# Patient Record
Sex: Female | Born: 2000 | Hispanic: Yes | Marital: Single | Smoking: Never smoker
Health system: Southern US, Community
[De-identification: ages and names within clinical notes are randomized; demographics above are authoritative.]

## PROBLEM LIST (undated history)

## (undated) DIAGNOSIS — F32A Depression, unspecified: Secondary | ICD-10-CM

## (undated) DIAGNOSIS — F329 Major depressive disorder, single episode, unspecified: Secondary | ICD-10-CM

## (undated) DIAGNOSIS — N390 Urinary tract infection, site not specified: Secondary | ICD-10-CM

## (undated) HISTORY — PX: SALPINGECTOMY: SHX328

## (undated) HISTORY — PX: WISDOM TOOTH EXTRACTION: SHX21

## (undated) HISTORY — PX: DILATION AND CURETTAGE OF UTERUS: SHX78

---
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
## 2019-10-02 DIAGNOSIS — O009 Unspecified ectopic pregnancy without intrauterine pregnancy: Secondary | ICD-10-CM | POA: Insufficient documentation

## 2021-01-19 NOTE — L&D Delivery Note (Addendum)
LABOR COURSE SROM, expectant management, epidural.   Delivery Note Called to room and patient was complete and pushing. Head delivered ROA. No nuchal cord present. After 1-min of attempting to deliver shoulder/body, Ashley Brewer, CNM stepped in to manage the dystocia (see her attestation). At 1248 a viable female was delivered via Vaginal, Spontaneous.  Infant with spontaneous cry, placed on mother's abdomen, dried and stimulated for one minute and had a good heart beat and color but little tone and weak breathing efforts. Cord clamped and cut by FOB, baby taken to the warmer. Cord blood drawn. Placenta delivered spontaneously with gentle cord traction. Appears intact. Fundus firm with massage and Pitocin. Labia, perineum, vagina, and cervix inspected with hemostatic periurethral abrasion that needed no repair.   APGAR: 7, 9; weight pending.   Cord: 3VC with the following complications: velamentous insertion    Cord pH: not drawn   Anesthesia:  epidural  Episiotomy: None Lacerations: periurethral abrasion  Est. Blood Loss (mL): 100  Mom to postpartum.  Baby to Couplet care / Skin to Skin.  Ashley Chard, DO 08/22/21 1:06 PM     Attestation of Supervision of Student:  I confirm that I have verified the information documented in the medical student's note and that I have also personally  supervised  the history, physical exam and all medical decision making activities.  I have verified that all services and findings are accurately documented in this student's note; and I agree with management and plan as outlined in the documentation. I have also made any necessary editorial changes.  At one minute after the head delivered, I stepped in to manage the anterior shoulder dystocia. Pt placed in McRobert's position, I grasped the posterior axilla and applied downward pressure to deliver the shoulder and body. Vigorous stimulation given to the baby but as she remained limp with a weak cry/breathing  effort, we clamped/cut at and gave her to the the nurses for attention at the warmer. Baby was back skin to skin on mom by the time we exited the room.  Bernerd Limbo, CNM [facility] [employer] 08/22/2021 1:22 PM

## 2021-01-22 ENCOUNTER — Inpatient Hospital Stay (HOSPITAL_COMMUNITY)
Admission: AD | Admit: 2021-01-22 | Discharge: 2021-01-23 | Disposition: A | Discharge status: Home / Self Care | Payer: Medicaid Other | Attending: Obstetrics and Gynecology | Admitting: Obstetrics and Gynecology

## 2021-01-22 ENCOUNTER — Encounter (HOSPITAL_COMMUNITY): Payer: Self-pay | Admitting: Obstetrics and Gynecology

## 2021-01-22 ENCOUNTER — Other Ambulatory Visit: Payer: Self-pay

## 2021-01-22 DIAGNOSIS — R109 Unspecified abdominal pain: Secondary | ICD-10-CM | POA: Diagnosis not present

## 2021-01-22 DIAGNOSIS — R102 Pelvic and perineal pain: Secondary | ICD-10-CM | POA: Insufficient documentation

## 2021-01-22 DIAGNOSIS — Z3A08 8 weeks gestation of pregnancy: Secondary | ICD-10-CM | POA: Diagnosis not present

## 2021-01-22 DIAGNOSIS — O219 Vomiting of pregnancy, unspecified: Secondary | ICD-10-CM | POA: Insufficient documentation

## 2021-01-22 DIAGNOSIS — O26891 Other specified pregnancy related conditions, first trimester: Secondary | ICD-10-CM | POA: Insufficient documentation

## 2021-01-22 DIAGNOSIS — O3680X0 Pregnancy with inconclusive fetal viability, not applicable or unspecified: Secondary | ICD-10-CM | POA: Insufficient documentation

## 2021-01-22 DIAGNOSIS — O26899 Other specified pregnancy related conditions, unspecified trimester: Secondary | ICD-10-CM

## 2021-01-22 LAB — POCT PREGNANCY, URINE: Preg Test, Ur: POSITIVE — AB

## 2021-01-22 LAB — URINALYSIS, ROUTINE W REFLEX MICROSCOPIC
Bilirubin Urine: NEGATIVE
Glucose, UA: NEGATIVE mg/dL
Hgb urine dipstick: NEGATIVE
Ketones, ur: 80 mg/dL — AB
Leukocytes,Ua: NEGATIVE
Nitrite: NEGATIVE
Protein, ur: NEGATIVE mg/dL
Specific Gravity, Urine: 1.018 (ref 1.005–1.030)
pH: 6 (ref 5.0–8.0)

## 2021-01-22 NOTE — MAU Note (Signed)
..  Ashley Brewer is a 22 y.o. at Unknown here in MAU reporting: positive home pregnancy test yesterday. Reports some abdominal pain, breast soreness, N/V. Reports she throws up after every meal.  Had an ectopic pregnancy last year.  LMP: June Pain score: 6/10 Vitals:   01/22/21 2305  BP: 119/80  Pulse: 93  Resp: 17  Temp: 98.5 F (36.9 C)  SpO2: 100%   Lab orders placed from triage: Pregnancy test

## 2021-01-23 ENCOUNTER — Encounter (HOSPITAL_COMMUNITY): Payer: Self-pay | Admitting: Obstetrics and Gynecology

## 2021-01-23 ENCOUNTER — Inpatient Hospital Stay (HOSPITAL_COMMUNITY): Payer: Medicaid Other

## 2021-01-23 DIAGNOSIS — Z3A08 8 weeks gestation of pregnancy: Secondary | ICD-10-CM

## 2021-01-23 DIAGNOSIS — O26891 Other specified pregnancy related conditions, first trimester: Secondary | ICD-10-CM

## 2021-01-23 DIAGNOSIS — R109 Unspecified abdominal pain: Secondary | ICD-10-CM

## 2021-01-23 LAB — COMPREHENSIVE METABOLIC PANEL
ALT: 19 U/L (ref 0–44)
AST: 18 U/L (ref 15–41)
Albumin: 3.9 g/dL (ref 3.5–5.0)
Alkaline Phosphatase: 40 U/L (ref 38–126)
Anion gap: 8 (ref 5–15)
BUN: <5 mg/dL — ABNORMAL LOW (ref 6–20)
CO2: 21 mmol/L — ABNORMAL LOW (ref 22–32)
Calcium: 9.2 mg/dL (ref 8.9–10.3)
Chloride: 107 mmol/L (ref 98–111)
Creatinine, Ser: 0.57 mg/dL (ref 0.44–1.00)
GFR, Estimated: >60 mL/min (ref >60)
Glucose, Bld: 90 mg/dL (ref 70–99)
Potassium: 3.3 mmol/L — ABNORMAL LOW (ref 3.5–5.1)
Sodium: 136 mmol/L (ref 135–145)
Total Bilirubin: 0.6 mg/dL (ref 0.3–1.2)
Total Protein: 6.4 g/dL — ABNORMAL LOW (ref 6.5–8.1)

## 2021-01-23 LAB — CBC
HCT: 37.6 % (ref 36.0–46.0)
Hemoglobin: 13.1 g/dL (ref 12.0–15.0)
MCH: 30.9 pg (ref 26.0–34.0)
MCHC: 34.8 g/dL (ref 30.0–36.0)
MCV: 88.7 fL (ref 80.0–100.0)
Platelets: 215 10*3/uL (ref 150–400)
RBC: 4.24 MIL/uL (ref 3.87–5.11)
RDW: 11.9 % (ref 11.5–15.5)
WBC: 11 10*3/uL — ABNORMAL HIGH (ref 4.0–10.5)
nRBC: 0 % (ref 0.0–0.2)

## 2021-01-23 LAB — GC/CHLAMYDIA PROBE AMP (~~LOC~~) NOT AT ARMC
Chlamydia: NEGATIVE
Comment: NEGATIVE
Comment: NORMAL
Neisseria Gonorrhea: NEGATIVE

## 2021-01-23 LAB — WET PREP, GENITAL
Sperm: NONE SEEN
Trich, Wet Prep: NONE SEEN
WBC, Wet Prep HPF POC: <10 (ref <10)
Yeast Wet Prep HPF POC: NONE SEEN

## 2021-01-23 LAB — HCG, QUANTITATIVE, PREGNANCY: hCG, Beta Chain, Quant, S: 101093 m[IU]/mL — ABNORMAL HIGH (ref <5)

## 2021-01-23 MED ORDER — PROMETHAZINE HCL 25 MG PO TABS: 25.0000 mg | ORAL_TABLET | Freq: Four times a day (QID) | ORAL | 2 refills | Status: DC | PRN

## 2021-01-23 NOTE — MAU Provider Note (Signed)
Chief Complaint: Abdominal Pain and Nausea   Event Date/Time   First Provider Initiated Contact with Patient 01/23/21 0026        SUBJECTIVE HPI: Ashley Brewer is a 21 y.o. G2P0010 at 2266w5d by LMP who presents to maternity admissions reporting abdominal pain, mostly in upper abdomen, moves around "like gas pain".  Some lower cramping.  Had a left salpingectomy for ruptured ectopic in 2021.. She denies vaginal bleeding, vaginal itching/burning, urinary symptoms, h/a, dizziness, n/v, or fever/chills.    Abdominal Pain This is a new problem. The current episode started today. The problem occurs intermittently. The problem is unchanged. The pain is located in the generalized abdominal region. The quality of the pain is described as cramping. The pain does not radiate. Associated symptoms include nausea. Pertinent negatives include no anorexia, anxiety, constipation, diarrhea, dysuria, fever, myalgias or vomiting. Nothing relieves the symptoms. Treatments tried: simethicone. The treatment provided moderate relief.  RN Note: Ashley Brewer is a 21 y.o. at Unknown here in MAU reporting: positive home pregnancy test yesterday. Reports some abdominal pain, breast soreness, N/V. Reports she throws up after every meal.  Had an ectopic pregnancy last year.  LMP: June Pain score: 6/10  No past medical history on file.  Social History   Socioeconomic History   Marital status: Single    Spouse name: Not on file   Number of children: Not on file   Years of education: Not on file   Highest education level: Not on file  Occupational History   Not on file  Tobacco Use   Smoking status: Not on file   Smokeless tobacco: Not on file  Substance and Sexual Activity   Alcohol use: Not on file   Drug use: Not on file   Sexual activity: Not on file  Other Topics Concern   Not on file  Social History Narrative   Not on file   Social Determinants of Health   Financial Resource Strain: Not on  file  Food Insecurity: Not on file  Transportation Needs: Not on file  Physical Activity: Not on file  Stress: Not on file  Social Connections: Not on file  Intimate Partner Violence: Not on file   No current facility-administered medications on file prior to encounter.   No current outpatient medications on file prior to encounter.   Allergies  Allergen Reactions   Shellfish Allergy Anaphylaxis    I have reviewed patient's Past Medical Hx, Surgical Hx, Family Hx, Social Hx, medications and allergies.   ROS:  Review of Systems  Constitutional:  Negative for fever.  Gastrointestinal:  Positive for abdominal pain and nausea. Negative for anorexia, constipation, diarrhea and vomiting.  Genitourinary:  Negative for dysuria.  Musculoskeletal:  Negative for myalgias.  Psychiatric/Behavioral:  The patient is not nervous/anxious.   Review of Systems  Other systems negative   Physical Exam  Physical Exam Patient Vitals for the past 24 hrs:  BP Temp Temp src Pulse Resp SpO2 Height Weight  01/22/21 2359 122/83 99.3 F (37.4 C) Oral 82 18 100 % -- --  01/22/21 2305 119/80 98.5 F (36.9 C) Oral 93 17 100 % 5' (1.524 m) 80 kg   Constitutional: Well-developed, well-nourished female in no acute distress.  Cardiovascular: normal rate Respiratory: normal effort GI: Abd soft, non-tender.  MS: Extremities nontender, no edema, normal ROM Neurologic: Alert and oriented x 4.  GU: Neg CVAT.  LAB RESULTS Results for orders placed or performed during the hospital encounter of 01/22/21 (  from the past 24 hour(s))  Pregnancy, urine POC     Status: Abnormal   Collection Time: 01/22/21 11:13 PM  Result Value Ref Range   Preg Test, Ur POSITIVE (A) NEGATIVE  Urinalysis, Routine w reflex microscopic Urine, Clean Catch     Status: Abnormal   Collection Time: 01/22/21 11:18 PM  Result Value Ref Range   Color, Urine YELLOW YELLOW   APPearance HAZY (A) CLEAR   Specific Gravity, Urine 1.018 1.005  - 1.030   pH 6.0 5.0 - 8.0   Glucose, UA NEGATIVE NEGATIVE mg/dL   Hgb urine dipstick NEGATIVE NEGATIVE   Bilirubin Urine NEGATIVE NEGATIVE   Ketones, ur 80 (A) NEGATIVE mg/dL   Protein, ur NEGATIVE NEGATIVE mg/dL   Nitrite NEGATIVE NEGATIVE   Leukocytes,Ua NEGATIVE NEGATIVE  Wet prep, genital     Status: Abnormal   Collection Time: 01/23/21 12:33 AM   Specimen: Vaginal  Result Value Ref Range   Yeast Wet Prep HPF POC NONE SEEN NONE SEEN   Trich, Wet Prep NONE SEEN NONE SEEN   Clue Cells Wet Prep HPF POC PRESENT (A) NONE SEEN   WBC, Wet Prep HPF POC <10 <10   Sperm NONE SEEN   CBC     Status: Abnormal   Collection Time: 01/23/21 12:50 AM  Result Value Ref Range   WBC 11.0 (H) 4.0 - 10.5 K/uL   RBC 4.24 3.87 - 5.11 MIL/uL   Hemoglobin 13.1 12.0 - 15.0 g/dL   HCT 58.0 99.8 - 33.8 %   MCV 88.7 80.0 - 100.0 fL   MCH 30.9 26.0 - 34.0 pg   MCHC 34.8 30.0 - 36.0 g/dL   RDW 25.0 53.9 - 76.7 %   Platelets 215 150 - 400 K/uL   nRBC 0.0 0.0 - 0.2 %  Comprehensive metabolic panel     Status: Abnormal   Collection Time: 01/23/21 12:50 AM  Result Value Ref Range   Sodium 136 135 - 145 mmol/L   Potassium 3.3 (L) 3.5 - 5.1 mmol/L   Chloride 107 98 - 111 mmol/L   CO2 21 (L) 22 - 32 mmol/L   Glucose, Bld 90 70 - 99 mg/dL   BUN <5 (L) 6 - 20 mg/dL   Creatinine, Ser 3.41 0.44 - 1.00 mg/dL   Calcium 9.2 8.9 - 93.7 mg/dL   Total Protein 6.4 (L) 6.5 - 8.1 g/dL   Albumin 3.9 3.5 - 5.0 g/dL   AST 18 15 - 41 U/L   ALT 19 0 - 44 U/L   Alkaline Phosphatase 40 38 - 126 U/L   Total Bilirubin 0.6 0.3 - 1.2 mg/dL   GFR, Estimated >90 >24 mL/min   Anion gap 8 5 - 15  hCG, quantitative, pregnancy     Status: Abnormal   Collection Time: 01/23/21 12:50 AM  Result Value Ref Range   hCG, Beta Chain, Quant, S 101,093 (H) <5 mIU/mL     IMAGING US OB Comp Less 14 Wks  Result Date: 01/23/2021 CLINICAL DATA:  Pelvic pain. LMP: 12/07/2020 corresponding to an estimated gestational age of [redacted] weeks, 5  days. EXAM: OBSTETRIC <14 WK ULTRASOUND TECHNIQUE: Transabdominal ultrasound was performed for evaluation of the gestation as well as the maternal uterus and adnexal regions. COMPARISON:  None. FINDINGS: Intrauterine gestational sac: Single intrauterine gestational sac. Yolk sac:  Not seen Embryo:  Present Cardiac Activity: Detected Heart Rate: 152 bpm CRL:   17 mm   8 w 0 d  [place] EDC: 09/04/2021 Subchorionic hemorrhage:  None visualized. Maternal uterus/adnexae: The maternal ovaries are unremarkable. IMPRESSION: Single live intrauterine pregnancy with an estimated gestational age of [redacted] weeks, 0 days. Electronically Signed   By: Elgie Collard M.D.   On: 01/23/2021 01:52     MAU Management/MDM: Ordered usual first trimester r/o ectopic labs.   Pelvic exam and cultures done Will check baseline Ultrasound to rule out ectopic.  This bleeding/pain can represent a normal pregnancy with bleeding, spontaneous abortion or even an ectopic which can be life-threatening.  The process as listed above helps to determine which of these is present.  Reviewed results and intrauterine location of viable single embryo Patient wants to get care here this time, list given  ASSESSMENT Abdominal pain in early pregnancy Pregnancy of unknown location Colicky abdominal pain, improved Confirmed single IUP at [redacted]w[redacted]d   PLAN Discharge home Encouraged to start prenatal care [place] for gas pains. List of providers given  Pt stable at time of discharge. Encouraged to return here if she develops worsening of symptoms, increase in pain, fever, or other concerning symptoms.    Wynelle Bourgeois CNM, MSN Certified Nurse-Midwife 01/23/2021  12:26 AM

## 2021-01-23 NOTE — Discharge Instructions (Signed)
[facility] Providers   [facility]             [address], [place], [place] [address] [phone]  [facility]                                                             [address], [address], [place], [place], [address] [phone]  [facility]                                    [address], [address], , [place], [address] [phone]  [facility] [place] [address], [address], [place], [place], [address] [phone]  [facility]                                 [address], [place], [place], [address] [phone]  [facility]                                    [address], [place], [place], [address] [phone]  [facility] [address], [address], [place], [place], [address]                              [facility] [address], [place], [place], [address] [phone]  [facility]         Phone: [phone]  [facility]      Phone: [phone]   [facility]      Phone: [phone]  [facility]         Phone: [phone]   [facility]    Phone: [phone]  [facility]      Phone: [phone]  [facility]     Phone: [phone]  [facility]      Phone: [phone]  

## 2021-02-11 ENCOUNTER — Telehealth: Payer: Self-pay

## 2021-02-11 NOTE — Telephone Encounter (Signed)
Pt has NOB appt with [place] on 02/20/21. Pt called office with questions. Attempted to call pt back. Pt did not answer and voicemail box was full so I was unable to leave message.MyChart message sent.

## 2021-02-11 NOTE — Telephone Encounter (Signed)
Pt was calling to make sure she can take Vitamin B6 in pregnancy for nausea. Pt was told she can take OTC Vitamin B6 and Unisom per protocol book. Pt states Phenergan made her drowsy and she doesn't want to be drowsy while she works from home. Pt is aware that drowsiness can be a side effect of nausea medication. Pt expressed understanding.

## 2021-02-17 ENCOUNTER — Encounter: Payer: Self-pay | Admitting: Obstetrics and Gynecology

## 2021-02-17 DIAGNOSIS — Z3490 Encounter for supervision of normal pregnancy, unspecified, unspecified trimester: Secondary | ICD-10-CM | POA: Insufficient documentation

## 2021-02-17 NOTE — Progress Notes (Signed)
History:   Ashley Brewer is a 21 y.o. G3P0020 at [redacted]w[redacted]d by LMP, early ultrasound being seen today for her first obstetrical visit.  Her obstetrical history is significant for  ectopic pregnancy but this is a confirmed IUP at 8w . This is a desired pregnancy.  Patient does intend to breast feed.   Patient reports nausea.   Partner/Fiance and FOB at bedside contributing to history.    HISTORY: OB History  Gravida Para Term Preterm AB Living  3 0 0 0 2 0  SAB IAB Ectopic Multiple Live Births  1 0 1 0 0    # Outcome Date GA Lbr Len/2nd Weight Sex Delivery Anes PTL Lv  3 Current           2 Ectopic 10/02/19             Birth Comments: Left tube removed  1 SAB 10/15/17             Past Medical History:  Diagnosis Date   Depression    Urinary tract infection    pt. thinks she';s been dx with UTI   Past Surgical History:  Procedure Laterality Date   DILATION AND CURETTAGE OF UTERUS     SALPINGECTOMY Left    WISDOM TOOTH EXTRACTION     History reviewed. No pertinent family history. Social History   Tobacco Use   Smokeless tobacco: Never  Vaping Use   Vaping Use: Never used  Substance Use Topics   Alcohol use: No    Comment: Patient reports she consumes alcohol (amount unspecified)   Drug use: No    Comment: Patient denies   Allergies  Allergen Reactions   Shellfish Allergy Anaphylaxis   Shrimp [Shellfish Allergy] Nausea And Vomiting and Rash   Current Outpatient Medications on File Prior to Visit  Medication Sig Dispense Refill   Prenatal Vit-Fe Fumarate-FA (PRENATAL VITAMIN PO) Take by mouth.     promethazine (PHENERGAN) 25 MG tablet Take 1 tablet (25 mg total) by mouth every 6 (six) hours as needed for nausea or vomiting. 30 tablet 2   pyridoxine (B-6) 100 MG tablet Take 100 mg by mouth daily.     No current facility-administered medications on file prior to visit.    Review of Systems Pertinent items noted in HPI and remainder of comprehensive ROS  otherwise negative.  Physical Exam:   Vitals:   02/20/21 1307  BP: 121/80  Pulse: (!) 104  Weight: 174 lb (78.9 kg)   Fetal Heart Rate (bpm): 163 General: well-developed, well-nourished female in no acute distress  Breasts:  normal appearance, no masses or tenderness bilaterally  Skin: normal coloration and turgor, no rashes  Neurologic: oriented, normal, negative, normal mood  Extremities: normal strength, tone, and muscle mass, ROM of all joints is normal  HEENT PERRLA, extraocular movement intact and sclera clear, anicteric  Neck supple and no masses  Cardiovascular: regular rate and rhythm  Respiratory:  no respiratory distress, normal breath sounds  Abdomen: soft, non-tender; bowel sounds normal; no masses,  no organomegaly  Pelvic: normal external genitalia, no lesions, normal vaginal mucosa, normal vaginal discharge, normal cervix, pap smear done. Uterine size:  c/w dates    Assessment:    Pregnancy: G3P0020 Patient Active Problem List   Diagnosis Date Noted   Supervision of normal pregnancy 02/17/2021   Ectopic pregnancy 10/02/2019   Macromastia 11/16/2016     Plan:    1. Encounter for supervision of normal pregnancy, antepartum, unspecified gravidity -  Offered and recommended flu shot - pt declines. Discussed risk of flu in pregnancy. Reviewed Tamiflu.  - Discussed waterbirth option if desired by pt, if pregnancy remains uncomplicated, and she meets requirements I.e. class and CNM visit.  - She is doing b6 and phenergan for nausea but phenergan makes her sleepy - we will try pepcid. If this doesn't help, we will try reglan.  - Discussed PT for sciatica and hip pain. She sits a lot for work and thinks this is contributing.   2. MDD (major depressive disorder), recurrent episode, mild (HCC) - She reports this is old history from when she had a dysfunctional relationship with her mom. She has separated her self from her mom and she has no problems now. She denies any  depression and did not feel like she had it before.    Initial labs drawn. Continue prenatal vitamins. Problem list reviewed and updated. Genetic Screening discussed, First trimester screen, Quad screen, and NIPS: declined. Ultrasound discussed; fetal anatomic survey: ordered. Anticipatory guidance about prenatal visits given including labs, ultrasounds, and testing. Discussed usage of Babyscripts and virtual visits as additional source of managing and completing prenatal visits in midst of coronavirus and pandemic.   Encouraged to complete MyChart Registration for her ability to review results, send requests, and have questions addressed.  The nature of [facility] with multiple MDs and Advanced Practice Providers was explained to patient; also emphasized that residents, students are part of our team. Routine obstetric precautions reviewed. Encouraged to seek out care at office or emergency room [facility] MAU preferred) for urgent and/or emergent concerns. Return in about 4 weeks (around 03/20/2021) for OB VISIT, MD or APP.    Milas Hock, MD, FACOG Obstetrician & Gynecologist, [facility], [facility]

## 2021-02-20 ENCOUNTER — Ambulatory Visit (INDEPENDENT_AMBULATORY_CARE_PROVIDER_SITE_OTHER): Payer: Medicaid Other | Admitting: Obstetrics and Gynecology

## 2021-02-20 ENCOUNTER — Encounter: Payer: Self-pay | Admitting: Obstetrics and Gynecology

## 2021-02-20 ENCOUNTER — Other Ambulatory Visit: Payer: Self-pay

## 2021-02-20 ENCOUNTER — Other Ambulatory Visit (HOSPITAL_COMMUNITY)
Admission: RE | Admit: 2021-02-20 | Discharge: 2021-02-20 | Disposition: A | Discharge status: Home / Self Care | Payer: Medicaid Other | Source: Ambulatory Visit | Attending: Obstetrics and Gynecology | Admitting: Obstetrics and Gynecology

## 2021-02-20 VITALS — BP 121/80 | HR 104 | Wt 174.0 lb

## 2021-02-20 DIAGNOSIS — F330 Major depressive disorder, recurrent, mild: Secondary | ICD-10-CM

## 2021-02-20 DIAGNOSIS — Z3490 Encounter for supervision of normal pregnancy, unspecified, unspecified trimester: Secondary | ICD-10-CM | POA: Diagnosis present

## 2021-02-20 DIAGNOSIS — M5432 Sciatica, left side: Secondary | ICD-10-CM

## 2021-02-20 NOTE — Progress Notes (Signed)
Pt declined genetic testing Pt would like a different nausea med that doesn't make her so drowsy

## 2021-02-21 LAB — OBSTETRIC PANEL
Absolute Monocytes: 357 cells/uL (ref 200–950)
Antibody Screen: NOT DETECTED
Basophils Absolute: 33 cells/uL (ref 0–200)
Basophils Relative: 0.4 %
Eosinophils Absolute: 116 cells/uL (ref 15–500)
Eosinophils Relative: 1.4 %
HCT: 35.9 % (ref 35.0–45.0)
Hemoglobin: 12.4 g/dL (ref 11.7–15.5)
Hepatitis B Surface Ag: NONREACTIVE
Lymphs Abs: 2034 cells/uL (ref 850–3900)
MCH: 30.8 pg (ref 27.0–33.0)
MCHC: 34.5 g/dL (ref 32.0–36.0)
MCV: 89.3 fL (ref 80.0–100.0)
MPV: 11.4 fL (ref 7.5–12.5)
Monocytes Relative: 4.3 %
Neutro Abs: 5760 cells/uL (ref 1500–7800)
Neutrophils Relative %: 69.4 %
Platelets: 254 10*3/uL (ref 140–400)
RBC: 4.02 10*6/uL (ref 3.80–5.10)
RDW: 11.9 % (ref 11.0–15.0)
RPR Ser Ql: NONREACTIVE
Rubella: 4.37 Index
Total Lymphocyte: 24.5 %
WBC: 8.3 10*3/uL (ref 3.8–10.8)

## 2021-02-21 LAB — HEPATITIS C ANTIBODY
Hepatitis C Ab: NONREACTIVE
SIGNAL TO CUT-OFF: <0.02 (ref <1.00)

## 2021-02-21 LAB — HIV ANTIBODY (ROUTINE TESTING W REFLEX): HIV 1&2 Ab, 4th Generation: NONREACTIVE

## 2021-02-22 LAB — URINE CULTURE, OB REFLEX

## 2021-02-22 LAB — CULTURE, OB URINE

## 2021-02-24 ENCOUNTER — Encounter: Payer: Self-pay | Admitting: *Deleted

## 2021-02-24 LAB — CYTOLOGY - PAP
Chlamydia: NEGATIVE
Comment: NEGATIVE
Comment: NORMAL
Diagnosis: NEGATIVE
Neisseria Gonorrhea: NEGATIVE

## 2021-03-17 NOTE — Progress Notes (Signed)
? ?  PRENATAL VISIT NOTE ? ?Subjective:  ?Ashley Brewer is a 21 y.o. G3P0020 at [redacted]w[redacted]d being seen today for ongoing prenatal care.  She is currently monitored for the following issues for this low-risk pregnancy and has Ectopic pregnancy; Macromastia; and Supervision of normal pregnancy on their problem list. ? ?Patient reports no complaints except some swelling after work.  Contractions: Not present. Vag. Bleeding: None.  Movement: Absent. Denies leaking of fluid.  ? ?The following portions of the patient's history were reviewed and updated as appropriate: allergies, current medications, past family history, past medical history, past social history, past surgical history and problem list.  ? ?Objective:  ? ?Vitals:  ? 03/20/21 1316  ?BP: 114/74  ?Pulse: 80  ?Weight: 176 lb (79.8 kg)  ? ? ?Fetal Status: Fetal Heart Rate (bpm): 147   Movement: Absent    ? ?General:  Alert, oriented and cooperative. Patient is in no acute distress.  ?Skin: Skin is warm and dry. No rash noted.   ?Cardiovascular: Normal heart rate noted  ?Respiratory: Normal respiratory effort, no problems with respiration noted  ?Abdomen: Soft, gravid, appropriate for gestational age.  Pain/Pressure: Absent     ?Pelvic: Cervical exam deferred        ?Extremities: Normal range of motion.  Edema: None  ?Mental Status: Normal mood and affect. Normal behavior. Normal judgment and thought content.  ? ?Assessment and Plan:  ?Pregnancy: G3P0020 at [redacted]w[redacted]d ?1. Encounter for supervision of normal pregnancy, antepartum, unspecified gravidity ?- Scheduled for anatomy US on 3/23 ?- PT has been helping LBP. Reviewed compression socks and elevating feet for swelling.  ? ?Preterm labor symptoms and general obstetric precautions including but not limited to vaginal bleeding, contractions, leaking of fluid and fetal movement were reviewed in detail with the patient. ?Please refer to After Visit Summary for other counseling recommendations.  ? ?Return in about 4 weeks  (around 04/17/2021) for OB VISIT, MD or APP. ? ?Future Appointments  ?Date Time Provider Department Center  ?04/10/2021 10:45 AM [facility] US5 [facility]  ?04/22/2021  1:30 PM Leftwich-Kirby, Wilmer Floor, CNM [facility]  ? ? ?Milas Hock, MD ?

## 2021-03-20 ENCOUNTER — Other Ambulatory Visit: Payer: Self-pay

## 2021-03-20 ENCOUNTER — Ambulatory Visit (INDEPENDENT_AMBULATORY_CARE_PROVIDER_SITE_OTHER): Payer: Medicaid Other | Admitting: Obstetrics and Gynecology

## 2021-03-20 ENCOUNTER — Encounter: Payer: Self-pay | Admitting: Obstetrics and Gynecology

## 2021-03-20 VITALS — BP 114/74 | HR 80 | Wt 176.0 lb

## 2021-03-20 DIAGNOSIS — Z3490 Encounter for supervision of normal pregnancy, unspecified, unspecified trimester: Secondary | ICD-10-CM

## 2021-04-10 ENCOUNTER — Other Ambulatory Visit: Payer: Self-pay | Admitting: Obstetrics and Gynecology

## 2021-04-10 ENCOUNTER — Encounter: Payer: Self-pay | Admitting: Obstetrics and Gynecology

## 2021-04-10 ENCOUNTER — Other Ambulatory Visit: Payer: Self-pay | Admitting: *Deleted

## 2021-04-10 ENCOUNTER — Other Ambulatory Visit: Payer: Self-pay

## 2021-04-10 ENCOUNTER — Ambulatory Visit: Payer: Medicaid Other | Attending: Obstetrics and Gynecology

## 2021-04-10 DIAGNOSIS — O99212 Obesity complicating pregnancy, second trimester: Secondary | ICD-10-CM | POA: Diagnosis not present

## 2021-04-10 DIAGNOSIS — Z3A19 19 weeks gestation of pregnancy: Secondary | ICD-10-CM | POA: Insufficient documentation

## 2021-04-10 DIAGNOSIS — O43122 Velamentous insertion of umbilical cord, second trimester: Secondary | ICD-10-CM | POA: Insufficient documentation

## 2021-04-10 DIAGNOSIS — Z3490 Encounter for supervision of normal pregnancy, unspecified, unspecified trimester: Secondary | ICD-10-CM

## 2021-04-10 DIAGNOSIS — O283 Abnormal ultrasonic finding on antenatal screening of mother: Secondary | ICD-10-CM | POA: Insufficient documentation

## 2021-04-10 DIAGNOSIS — O43129 Velamentous insertion of umbilical cord, unspecified trimester: Secondary | ICD-10-CM | POA: Insufficient documentation

## 2021-04-10 DIAGNOSIS — O358XX0 Maternal care for other (suspected) fetal abnormality and damage, not applicable or unspecified: Secondary | ICD-10-CM | POA: Diagnosis not present

## 2021-04-10 DIAGNOSIS — Z363 Encounter for antenatal screening for malformations: Secondary | ICD-10-CM | POA: Insufficient documentation

## 2021-04-22 ENCOUNTER — Ambulatory Visit (INDEPENDENT_AMBULATORY_CARE_PROVIDER_SITE_OTHER): Payer: Medicaid Other | Admitting: Advanced Practice Midwife

## 2021-04-22 ENCOUNTER — Telehealth: Payer: Self-pay | Admitting: *Deleted

## 2021-04-22 VITALS — BP 122/73 | HR 84 | Wt 177.0 lb

## 2021-04-22 DIAGNOSIS — O43122 Velamentous insertion of umbilical cord, second trimester: Secondary | ICD-10-CM

## 2021-04-22 DIAGNOSIS — Z3490 Encounter for supervision of normal pregnancy, unspecified, unspecified trimester: Secondary | ICD-10-CM | POA: Diagnosis not present

## 2021-04-22 DIAGNOSIS — O2612 Low weight gain in pregnancy, second trimester: Secondary | ICD-10-CM

## 2021-04-22 DIAGNOSIS — M5432 Sciatica, left side: Secondary | ICD-10-CM

## 2021-04-22 DIAGNOSIS — Z3A20 20 weeks gestation of pregnancy: Secondary | ICD-10-CM

## 2021-04-22 NOTE — Progress Notes (Signed)
? ?  PRENATAL VISIT NOTE ? ?Subjective:  ?Ashley Brewer is a 21 y.o. G3P0020 at [redacted]w[redacted]d being seen today for ongoing prenatal care.  She is currently monitored for the following issues for this low-risk pregnancy and has Ectopic pregnancy; Macromastia; Supervision of normal pregnancy; Echogenic intracardiac focus of fetus on prenatal ultrasound; and Umbilical cord, velamentous insertion on their problem list. ? ?Patient reports  pelvic/back pain, improved with exercises .  Contractions: Not present. Vag. Bleeding: None.  Movement: Absent. Denies leaking of fluid.  ? ?The following portions of the patient's history were reviewed and updated as appropriate: allergies, current medications, past family history, past medical history, past social history, past surgical history and problem list.  ? ?Objective:  ? ?Vitals:  ? 04/22/21 1342  ?BP: 122/73  ?Pulse: 84  ?Weight: 177 lb (80.3 kg)  ? ? ?Fetal Status: Fetal Heart Rate (bpm): 147   Movement: Absent    ? ?General:  Alert, oriented and cooperative. Patient is in no acute distress.  ?Skin: Skin is warm and dry. No rash noted.   ?Cardiovascular: Normal heart rate noted  ?Respiratory: Normal respiratory effort, no problems with respiration noted  ?Abdomen: Soft, gravid, appropriate for gestational age.  Pain/Pressure: Absent     ?Pelvic: Cervical exam deferred        ?Extremities: Normal range of motion.  Edema: None  ?Mental Status: Normal mood and affect. Normal behavior. Normal judgment and thought content.  ? ?Assessment and Plan:  ?Pregnancy: G3P0020 at [redacted]w[redacted]d ?1. Encounter for supervision of normal pregnancy, antepartum, unspecified gravidity ?--Anticipatory guidance about next visits/weeks of pregnancy given.  ?--Interested in waterbirth, questions answered. Pt to enroll in class, see midwives for most visits. Discussed waterbirth as option for low risk pregnancy. Pregnancy is hard to predict and things may arise that prevent immersion in water but labor can be  supported with freedom of movement, birthing ball, shower and birth can be supported in position of pt choosing, etc, even if waterbirth becomes unavailable. Pt states understanding.  ?--Pt with possible velamentous cord insertion and this was discussed at today's visit. If confirmed, pt can often have normal pregnancy, normal vaginal delivery, but continuous monitoring during labor is recommended so she would not be a candidate for WB. Pt and her s/o state understanding.  ?--Encouraged to take childbirth classes and explore labor support techniques other than immersion in water. ? ?2. Sciatica of left side ?--Improved with exercises done at home ? ?3. Velamentous insertion of umbilical cord in second trimester ?--Possible, per MFM [place]. F/U on 4/21. ? ?4. [redacted] weeks gestation of pregnancy ? ? ?5. Poor weight gain of pregnancy, second trimester ?--Pt with poor appetite, reports ~ 10 lb weight loss in pregnancy ?--Discussed dietary options, including liquid calories with smoothies, Ensure.   ?--Pt to try adding calories, follow up on weight at next visit ? ?Preterm labor symptoms and general obstetric precautions including but not limited to vaginal bleeding, contractions, leaking of fluid and fetal movement were reviewed in detail with the patient. ?Please refer to After Visit Summary for other counseling recommendations.  ? ?Return in about 4 weeks (around 05/20/2021) for Midwife preferred, LOB. ? ?Future Appointments  ?Date Time Provider Department Center  ?05/09/2021 11:15 AM [facility] NURSE [facility]  ?05/09/2021 11:30 AM [facility] US2 [facility]  ?05/22/2021  2:30 PM Milas Hock, MD [facility]  ? ? ?Sharen Counter, CNM  ?

## 2021-04-22 NOTE — Telephone Encounter (Signed)
Voicemail full.

## 2021-04-22 NOTE — Patient Instructions (Signed)
?  Considering Waterbirth? ?Guide for patients at [facility] ?Why consider waterbirth? ?Gentle birth for babies  ?Less pain medicine used in labor  ?May allow for passive descent/less pushing  ?May reduce perineal tears  ?More mobility and instinctive maternal position changes  ?Increased maternal relaxation  ? ?Is waterbirth safe? What are the risks of infection, drowning or other complications? ?Infection:  ?Very low risk (3.7 % for tub vs 4.8% for bed)  ?7 in 8000 waterbirths with documented infection  ?Poorly cleaned equipment most common cause  ?Slightly lower group B strep transmission rate  ?Drowning  ?Maternal:  ?Very low risk  ?Related to seizures or fainting  ?Newborn:  ?Very low risk. No evidence of increased risk of respiratory problems in multiple large studies  ?Physiological protection from breathing under water  ?Avoid underwater birth if there are any fetal complications  ?Once baby's head is out of the water, keep it out.  ?Birth complication  ?Some reports of cord trauma, but risk decreased by bringing baby to surface gradually  ?No evidence of increased risk of shoulder dystocia. Mothers can usually change positions faster in water than in a bed, possibly aiding the maneuvers to free the shoulder.  ? ?There are 2 things you MUST do to have a waterbirth with [facility]: ?Attend a waterbirth class at [facility] [place]   ?3rd Wednesday of every month from 7-9 pm (virtual during COVID) ?Free ?Register online at [email or web address] or [email or web address] or by calling [phone] ?Bring us the certificate from the class to your prenatal appointment or send via MyChart ?Meet with a midwife at 36 weeks* to see if you can still plan a waterbirth and to sign the consent.  ? ?*We also recommend that you schedule as many of your prenatal visits with a midwife as possible.   ? ?Helpful information: ?You may want to bring a bathing suit top to the hospital  to wear during labor but this is optional.  All other supplies are provided by the hospital. ?Please arrive at the hospital with signs of active labor, and do not wait at home until late in labor. It takes 45 min- 2 hours for COVID testing, fetal monitoring, and check in to your room to take place, plus transport and filling of the waterbirth tub.   ? ?Things that would prevent you from having a waterbirth: ?Unknown or Positive COVID-19 diagnosis upon admission to hospital* ?Premature, <37wks  ?Previous cesarean birth  ?Presence of thick meconium-stained fluid  ?Multiple gestation (Twins, triplets, etc.)  ?Uncontrolled diabetes or gestational diabetes requiring medication  ?Hypertension diagnosed in pregnancy or preexisting hypertension (gestational hypertension, preeclampsia, or chronic hypertension) ?Fetal growth restriction (your baby measures less than 10th percentile on ultrasound) ?Heavy vaginal bleeding  ?Non-reassuring fetal heart rate  ?Active infection (MRSA, etc.). Group B Strep is NOT a contraindication for waterbirth.  ?If your labor has to be induced and induction method requires continuous monitoring of the baby's heart rate  ?Other risks/issues identified by your obstetrical provider  ? ?Please remember that birth is unpredictable. Under certain unforeseeable circumstances your provider may advise against giving birth in the tub. These decisions will be made on a case-by-case basis and with the safety of you and your baby as our highest priority. ? ? ?*Please remember that in order to have a waterbirth, you must test Negative to COVID-19 upon admission to the hospital. ? ?Updated 04/29/20 ? ?

## 2021-04-23 ENCOUNTER — Encounter: Payer: Self-pay | Admitting: Advanced Practice Midwife

## 2021-04-23 ENCOUNTER — Telehealth: Payer: Self-pay

## 2021-04-23 NOTE — Telephone Encounter (Signed)
Pt called upset that the diagnosis of "depression" is in her chart and on PMH form. Pt states she discussed this with Dr.Duncan and thinks that her pediatrician misdiagnosed her with this. Pt was told that Dr.Duncan did remove other diagnosis out her chart as requested by pt such as "oppositional defiant disorder" as well as others but "depression" is still under her medical history. Pt's partner was yelling in the background calling me a "liar". I spoke with Dr.Duncan and reassured pt that she did remove certain diagnosis that was requested. Pt is requesting I remove "depression" out of her chart. Dr.Duncan approved me taking depression out of pt's chart. I assured pt I would take it out and I will fill out a new PMH form that does not include any mental health/illness diagnosis. Caseworker has not picked up PMH form yet so I will shred the old one and have them pick up the new one that does not include depression.  ?

## 2021-05-09 ENCOUNTER — Encounter: Payer: Self-pay | Admitting: *Deleted

## 2021-05-09 ENCOUNTER — Ambulatory Visit: Payer: Medicaid Other | Attending: Obstetrics

## 2021-05-09 ENCOUNTER — Other Ambulatory Visit: Payer: Self-pay | Admitting: *Deleted

## 2021-05-09 ENCOUNTER — Ambulatory Visit: Payer: Medicaid Other | Admitting: *Deleted

## 2021-05-09 VITALS — BP 120/68 | HR 82

## 2021-05-09 DIAGNOSIS — Z363 Encounter for antenatal screening for malformations: Secondary | ICD-10-CM | POA: Insufficient documentation

## 2021-05-09 DIAGNOSIS — Z3A23 23 weeks gestation of pregnancy: Secondary | ICD-10-CM

## 2021-05-09 DIAGNOSIS — O358XX0 Maternal care for other (suspected) fetal abnormality and damage, not applicable or unspecified: Secondary | ICD-10-CM | POA: Insufficient documentation

## 2021-05-09 DIAGNOSIS — O99212 Obesity complicating pregnancy, second trimester: Secondary | ICD-10-CM | POA: Diagnosis not present

## 2021-05-09 DIAGNOSIS — Z3490 Encounter for supervision of normal pregnancy, unspecified, unspecified trimester: Secondary | ICD-10-CM | POA: Insufficient documentation

## 2021-05-09 DIAGNOSIS — E669 Obesity, unspecified: Secondary | ICD-10-CM | POA: Diagnosis not present

## 2021-05-09 DIAGNOSIS — O43122 Velamentous insertion of umbilical cord, second trimester: Secondary | ICD-10-CM

## 2021-05-09 DIAGNOSIS — O283 Abnormal ultrasonic finding on antenatal screening of mother: Secondary | ICD-10-CM

## 2021-05-22 ENCOUNTER — Encounter: Payer: Medicaid Other | Admitting: Obstetrics and Gynecology

## 2021-06-03 NOTE — Progress Notes (Signed)
   PRENATAL VISIT NOTE  Subjective:  Ashley Brewer is a 21 y.o. G3P0020 at [redacted]w[redacted]d being seen today for ongoing prenatal care.  She is currently monitored for the following issues for this high-risk pregnancy and has Ectopic pregnancy; Macromastia; Supervision of normal pregnancy; Echogenic intracardiac focus of fetus on prenatal ultrasound; and Umbilical cord, velamentous insertion on their problem list.  Patient reports  reflux and abdominal discomfort Discomfort is sporadic, not timeable like PTL.  Marland Kitchen  Contractions: Not present. Vag. Bleeding: None.  Movement: Present. Denies leaking of fluid.   The following portions of the patient's history were reviewed and updated as appropriate: allergies, current medications, past family history, past medical history, past social history, past surgical history and problem list.   Objective:   Vitals:   06/05/21 1348  BP: 105/74  Pulse: 77  Weight: 180 lb (81.6 kg)    Fetal Status: Fetal Heart Rate (bpm): 147   Movement: Present     General:  Alert, oriented and cooperative. Patient is in no acute distress.  Skin: Skin is warm and dry. No rash noted.   Cardiovascular: Normal heart rate noted  Respiratory: Normal respiratory effort, no problems with respiration noted  Abdomen: Soft, gravid, appropriate for gestational age.  Pain/Pressure: Present     Pelvic: Cervical exam deferred        Extremities: Normal range of motion.  Edema: None  Mental Status: Normal mood and affect. Normal behavior. Normal judgment and thought content.   Assessment and Plan:  Pregnancy: G3P0020 at [redacted]w[redacted]d 1. Velamentous insertion of umbilical cord in second trimester - Growth normal on 4/21: 33%ile. Will continue to monitor growth  2. Encounter for supervision of normal pregnancy, antepartum, unspecified gravidity - 28w labs discussed and will be scheduled - Tdap recommended - pt will consider. Information given - Reiterated belly band for discomfort.  -  Reviewed tums for reflux.   3. Echogenic intracardiac focus of fetus on prenatal ultrasound Persistent on last [place]. Declined genetics  Preterm labor symptoms and general obstetric precautions including but not limited to vaginal bleeding, contractions, leaking of fluid and fetal movement were reviewed in detail with the patient. Please refer to After Visit Summary for other counseling recommendations.   Return in about 3 weeks (around 06/26/2021) for 2 hr GTT.  Future Appointments  Date Time Provider Department Center  06/12/2021 12:30 PM [facility] NURSE [facility]  06/12/2021 12:45 PM [facility]    Milas Hock, MD

## 2021-06-05 ENCOUNTER — Ambulatory Visit (INDEPENDENT_AMBULATORY_CARE_PROVIDER_SITE_OTHER): Payer: Medicaid Other | Admitting: Obstetrics and Gynecology

## 2021-06-05 ENCOUNTER — Encounter: Payer: Self-pay | Admitting: Obstetrics and Gynecology

## 2021-06-05 VITALS — BP 105/74 | HR 77 | Wt 180.0 lb

## 2021-06-05 DIAGNOSIS — O43122 Velamentous insertion of umbilical cord, second trimester: Secondary | ICD-10-CM

## 2021-06-05 DIAGNOSIS — Z3490 Encounter for supervision of normal pregnancy, unspecified, unspecified trimester: Secondary | ICD-10-CM

## 2021-06-05 DIAGNOSIS — O283 Abnormal ultrasonic finding on antenatal screening of mother: Secondary | ICD-10-CM

## 2021-06-05 NOTE — Progress Notes (Signed)
Pt c/o heartburn Pt c/o lower abdominal pain- pregnancy support band recommended

## 2021-06-12 ENCOUNTER — Other Ambulatory Visit: Payer: Self-pay | Admitting: *Deleted

## 2021-06-12 ENCOUNTER — Ambulatory Visit: Payer: Medicaid Other | Admitting: *Deleted

## 2021-06-12 ENCOUNTER — Encounter: Payer: Self-pay | Admitting: *Deleted

## 2021-06-12 ENCOUNTER — Ambulatory Visit: Payer: Medicaid Other | Attending: Obstetrics

## 2021-06-12 VITALS — BP 117/68 | HR 81

## 2021-06-12 DIAGNOSIS — O43122 Velamentous insertion of umbilical cord, second trimester: Secondary | ICD-10-CM | POA: Diagnosis present

## 2021-06-12 DIAGNOSIS — O99213 Obesity complicating pregnancy, third trimester: Secondary | ICD-10-CM | POA: Diagnosis not present

## 2021-06-12 DIAGNOSIS — Z3A28 28 weeks gestation of pregnancy: Secondary | ICD-10-CM | POA: Diagnosis not present

## 2021-06-12 DIAGNOSIS — O99212 Obesity complicating pregnancy, second trimester: Secondary | ICD-10-CM | POA: Diagnosis present

## 2021-06-12 DIAGNOSIS — O283 Abnormal ultrasonic finding on antenatal screening of mother: Secondary | ICD-10-CM | POA: Diagnosis present

## 2021-06-12 DIAGNOSIS — O43123 Velamentous insertion of umbilical cord, third trimester: Secondary | ICD-10-CM

## 2021-06-12 DIAGNOSIS — Z6834 Body mass index (BMI) 34.0-34.9, adult: Secondary | ICD-10-CM | POA: Diagnosis present

## 2021-06-12 DIAGNOSIS — O358XX0 Maternal care for other (suspected) fetal abnormality and damage, not applicable or unspecified: Secondary | ICD-10-CM | POA: Diagnosis not present

## 2021-06-12 DIAGNOSIS — E669 Obesity, unspecified: Secondary | ICD-10-CM

## 2021-07-08 ENCOUNTER — Ambulatory Visit: Payer: Medicaid Other | Admitting: *Deleted

## 2021-07-08 DIAGNOSIS — Z3490 Encounter for supervision of normal pregnancy, unspecified, unspecified trimester: Secondary | ICD-10-CM

## 2021-07-08 NOTE — Progress Notes (Addendum)
Pt here for 28 week labs only.  She has rescheduled her ROB appt.  She is declining her HIV testing

## 2021-07-08 NOTE — Addendum Note (Signed)
Addended by: Granville Lewis on: 07/08/2021 09:29 AM   Modules accepted: Orders

## 2021-07-09 ENCOUNTER — Encounter: Payer: Self-pay | Admitting: Obstetrics and Gynecology

## 2021-07-09 LAB — CBC
HCT: 34.9 % — ABNORMAL LOW (ref 35.0–45.0)
Hemoglobin: 12 g/dL (ref 11.7–15.5)
MCH: 30.6 pg (ref 27.0–33.0)
MCHC: 34.4 g/dL (ref 32.0–36.0)
MCV: 89 fL (ref 80.0–100.0)
MPV: 12.6 fL — ABNORMAL HIGH (ref 7.5–12.5)
Platelets: 208 10*3/uL (ref 140–400)
RBC: 3.92 10*6/uL (ref 3.80–5.10)
RDW: 12.6 % (ref 11.0–15.0)
WBC: 8.5 10*3/uL (ref 3.8–10.8)

## 2021-07-09 LAB — RPR: RPR Ser Ql: NONREACTIVE

## 2021-07-17 ENCOUNTER — Ambulatory Visit: Payer: Medicaid Other

## 2021-07-18 ENCOUNTER — Telehealth: Payer: Self-pay

## 2021-07-21 ENCOUNTER — Telehealth: Payer: Self-pay | Admitting: *Deleted

## 2021-07-21 ENCOUNTER — Ambulatory Visit: Payer: Medicaid Other

## 2021-07-21 NOTE — Telephone Encounter (Signed)
Pt called and left a message to have a call back.  I have called and it went to voicemail.  LM for pt to return my call.

## 2021-07-31 ENCOUNTER — Encounter: Payer: Medicaid Other | Admitting: Obstetrics and Gynecology

## 2021-08-04 ENCOUNTER — Ambulatory Visit: Payer: Medicaid Other | Admitting: *Deleted

## 2021-08-04 ENCOUNTER — Ambulatory Visit: Payer: Medicaid Other | Attending: Obstetrics

## 2021-08-04 VITALS — BP 108/72 | HR 84

## 2021-08-04 DIAGNOSIS — Z6834 Body mass index (BMI) 34.0-34.9, adult: Secondary | ICD-10-CM | POA: Diagnosis present

## 2021-08-04 DIAGNOSIS — Z3A35 35 weeks gestation of pregnancy: Secondary | ICD-10-CM

## 2021-08-04 DIAGNOSIS — O43123 Velamentous insertion of umbilical cord, third trimester: Secondary | ICD-10-CM | POA: Diagnosis not present

## 2021-08-04 DIAGNOSIS — E669 Obesity, unspecified: Secondary | ICD-10-CM | POA: Diagnosis not present

## 2021-08-04 DIAGNOSIS — O35BXX0 Maternal care for other (suspected) fetal abnormality and damage, fetal cardiac anomalies, not applicable or unspecified: Secondary | ICD-10-CM | POA: Diagnosis not present

## 2021-08-04 DIAGNOSIS — O99213 Obesity complicating pregnancy, third trimester: Secondary | ICD-10-CM

## 2021-08-08 ENCOUNTER — Ambulatory Visit: Payer: Medicaid Other

## 2021-08-14 ENCOUNTER — Encounter: Payer: Medicaid Other | Admitting: Obstetrics and Gynecology

## 2021-08-22 ENCOUNTER — Encounter (HOSPITAL_COMMUNITY): Payer: Self-pay | Admitting: Obstetrics and Gynecology

## 2021-08-22 ENCOUNTER — Inpatient Hospital Stay (HOSPITAL_COMMUNITY): Payer: Medicaid Other | Admitting: Anesthesiology

## 2021-08-22 ENCOUNTER — Inpatient Hospital Stay (HOSPITAL_COMMUNITY)
Admission: AD | Admit: 2021-08-22 | Discharge: 2021-08-24 | DRG: 807 | Disposition: A | Discharge status: Home / Self Care | Payer: Medicaid Other | Attending: Obstetrics and Gynecology | Admitting: Obstetrics and Gynecology

## 2021-08-22 ENCOUNTER — Other Ambulatory Visit: Payer: Self-pay

## 2021-08-22 DIAGNOSIS — Z3A38 38 weeks gestation of pregnancy: Secondary | ICD-10-CM

## 2021-08-22 DIAGNOSIS — O26893 Other specified pregnancy related conditions, third trimester: Secondary | ICD-10-CM | POA: Diagnosis present

## 2021-08-22 DIAGNOSIS — O43123 Velamentous insertion of umbilical cord, third trimester: Secondary | ICD-10-CM | POA: Diagnosis present

## 2021-08-22 DIAGNOSIS — O99214 Obesity complicating childbirth: Secondary | ICD-10-CM | POA: Diagnosis present

## 2021-08-22 DIAGNOSIS — O4202 Full-term premature rupture of membranes, onset of labor within 24 hours of rupture: Secondary | ICD-10-CM

## 2021-08-22 LAB — CBC
HCT: 38.3 % (ref 36.0–46.0)
Hemoglobin: 13 g/dL (ref 12.0–15.0)
MCH: 30.1 pg (ref 26.0–34.0)
MCHC: 33.9 g/dL (ref 30.0–36.0)
MCV: 88.7 fL (ref 80.0–100.0)
Platelets: 181 10*3/uL (ref 150–400)
RBC: 4.32 MIL/uL (ref 3.87–5.11)
RDW: 13.2 % (ref 11.5–15.5)
WBC: 11.8 10*3/uL — ABNORMAL HIGH (ref 4.0–10.5)
nRBC: 0 % (ref 0.0–0.2)

## 2021-08-22 LAB — TYPE AND SCREEN
ABO/RH(D): O POS
Antibody Screen: NEGATIVE

## 2021-08-22 LAB — POCT FERN TEST
POCT Fern Test: POSITIVE
POCT Fern Test: POSITIVE

## 2021-08-22 LAB — GROUP B STREP BY PCR: Group B strep by PCR: NEGATIVE

## 2021-08-22 LAB — RPR: RPR Ser Ql: NONREACTIVE

## 2021-08-22 LAB — HEMOGLOBIN A1C
Hgb A1c MFr Bld: 4.3 % — ABNORMAL LOW (ref 4.8–5.6)
Mean Plasma Glucose: 76.71 mg/dL

## 2021-08-22 LAB — HIV ANTIBODY (ROUTINE TESTING W REFLEX): HIV Screen 4th Generation wRfx: NONREACTIVE

## 2021-08-22 MED ORDER — ONDANSETRON HCL 4 MG/2ML IJ SOLN
4.0000 mg | Freq: Once | INTRAMUSCULAR | Status: AC
  Administered 2021-08-22: 4 mg via INTRAVENOUS
  Filled 2021-08-22: qty 2

## 2021-08-22 MED ORDER — ONDANSETRON HCL 4 MG/2ML IJ SOLN: 4.0000 mg | INTRAMUSCULAR | Status: DC | PRN

## 2021-08-22 MED ORDER — LACTATED RINGERS IV SOLN: INTRAVENOUS | Status: DC

## 2021-08-22 MED ORDER — PHENYLEPHRINE 80 MCG/ML (10ML) SYRINGE FOR IV PUSH (FOR BLOOD PRESSURE SUPPORT): 80.0000 ug | PREFILLED_SYRINGE | INTRAVENOUS | Status: DC | PRN

## 2021-08-22 MED ORDER — LACTATED RINGERS IV SOLN: 500.0000 mL | INTRAVENOUS | Status: DC | PRN

## 2021-08-22 MED ORDER — LIDOCAINE HCL (PF) 1 % IJ SOLN: 30.0000 mL | INTRAMUSCULAR | Status: DC | PRN

## 2021-08-22 MED ORDER — ZOLPIDEM TARTRATE 5 MG PO TABS: 5.0000 mg | ORAL_TABLET | Freq: Every evening | ORAL | Status: DC | PRN

## 2021-08-22 MED ORDER — DIPHENHYDRAMINE HCL 50 MG/ML IJ SOLN: 12.5000 mg | INTRAMUSCULAR | Status: DC | PRN

## 2021-08-22 MED ORDER — WITCH HAZEL-GLYCERIN EX PADS: 1.0000 | MEDICATED_PAD | CUTANEOUS | Status: DC | PRN

## 2021-08-22 MED ORDER — EPHEDRINE 5 MG/ML INJ: 10.0000 mg | INTRAVENOUS | Status: DC | PRN

## 2021-08-22 MED ORDER — SOD CITRATE-CITRIC ACID 500-334 MG/5ML PO SOLN: 30.0000 mL | ORAL | Status: DC | PRN

## 2021-08-22 MED ORDER — SIMETHICONE 80 MG PO CHEW: 80.0000 mg | CHEWABLE_TABLET | ORAL | Status: DC | PRN

## 2021-08-22 MED ORDER — ACETAMINOPHEN 325 MG PO TABS: 650.0000 mg | ORAL_TABLET | ORAL | Status: DC | PRN

## 2021-08-22 MED ORDER — FENTANYL CITRATE (PF) 100 MCG/2ML IJ SOLN: 50.0000 ug | INTRAMUSCULAR | Status: DC | PRN

## 2021-08-22 MED ORDER — ONDANSETRON HCL 4 MG/2ML IJ SOLN: 4.0000 mg | Freq: Four times a day (QID) | INTRAMUSCULAR | Status: DC | PRN

## 2021-08-22 MED ORDER — BENZOCAINE-MENTHOL 20-0.5 % EX AERO
1.0000 | INHALATION_SPRAY | CUTANEOUS | Status: DC | PRN
  Administered 2021-08-22: 1 via TOPICAL
  Filled 2021-08-22: qty 56

## 2021-08-22 MED ORDER — TETANUS-DIPHTH-ACELL PERTUSSIS 5-2.5-18.5 LF-MCG/0.5 IM SUSY: 0.5000 mL | PREFILLED_SYRINGE | Freq: Once | INTRAMUSCULAR | Status: DC

## 2021-08-22 MED ORDER — PENICILLIN G POT IN DEXTROSE 60000 UNIT/ML IV SOLN: 3.0000 10*6.[IU] | INTRAVENOUS | Status: DC

## 2021-08-22 MED ORDER — DIPHENHYDRAMINE HCL 25 MG PO CAPS: 25.0000 mg | ORAL_CAPSULE | Freq: Four times a day (QID) | ORAL | Status: DC | PRN

## 2021-08-22 MED ORDER — IBUPROFEN 600 MG PO TABS
600.0000 mg | ORAL_TABLET | Freq: Four times a day (QID) | ORAL | Status: DC
  Administered 2021-08-22 – 2021-08-24 (×6): 600 mg via ORAL
  Filled 2021-08-22 (×7): qty 1

## 2021-08-22 MED ORDER — FENTANYL CITRATE (PF) 100 MCG/2ML IJ SOLN
50.0000 ug | Freq: Once | INTRAMUSCULAR | Status: AC
  Administered 2021-08-22: 50 ug via INTRAVENOUS
  Filled 2021-08-22: qty 2

## 2021-08-22 MED ORDER — OXYTOCIN-SODIUM CHLORIDE 30-0.9 UT/500ML-% IV SOLN
2.5000 [IU]/h | INTRAVENOUS | Status: DC
  Administered 2021-08-22: 2.5 [IU]/h via INTRAVENOUS
  Filled 2021-08-22: qty 500

## 2021-08-22 MED ORDER — PRENATAL MULTIVITAMIN CH
1.0000 | ORAL_TABLET | Freq: Every day | ORAL | Status: DC
  Filled 2021-08-22: qty 1

## 2021-08-22 MED ORDER — OXYTOCIN BOLUS FROM INFUSION
333.0000 mL | Freq: Once | INTRAVENOUS | Status: AC
  Administered 2021-08-22: 333 mL via INTRAVENOUS

## 2021-08-22 MED ORDER — ACETAMINOPHEN 325 MG PO TABS
650.0000 mg | ORAL_TABLET | ORAL | Status: DC | PRN
  Administered 2021-08-22: 650 mg via ORAL
  Filled 2021-08-22: qty 2

## 2021-08-22 MED ORDER — LIDOCAINE HCL (PF) 1 % IJ SOLN
INTRAMUSCULAR | Status: DC | PRN
  Administered 2021-08-22: 11 mL via EPIDURAL

## 2021-08-22 MED ORDER — ONDANSETRON HCL 4 MG PO TABS: 4.0000 mg | ORAL_TABLET | ORAL | Status: DC | PRN

## 2021-08-22 MED ORDER — LACTATED RINGERS IV SOLN: 500.0000 mL | Freq: Once | INTRAVENOUS | Status: DC

## 2021-08-22 MED ORDER — DIBUCAINE (PERIANAL) 1 % EX OINT
1.0000 | TOPICAL_OINTMENT | CUTANEOUS | Status: DC | PRN
Start: 2021-08-22 — End: 2021-08-24

## 2021-08-22 MED ORDER — COCONUT OIL OIL: 1.0000 | TOPICAL_OIL | Status: DC | PRN

## 2021-08-22 MED ORDER — SODIUM CHLORIDE 0.9 % IV SOLN
5.0000 10*6.[IU] | Freq: Once | INTRAVENOUS | Status: AC
  Administered 2021-08-22: 5 10*6.[IU] via INTRAVENOUS
  Filled 2021-08-22: qty 5

## 2021-08-22 MED ORDER — SENNOSIDES-DOCUSATE SODIUM 8.6-50 MG PO TABS
2.0000 | ORAL_TABLET | Freq: Every day | ORAL | Status: DC
Start: 2021-08-23 — End: 2021-08-24
  Filled 2021-08-22: qty 2

## 2021-08-22 MED ORDER — FENTANYL-BUPIVACAINE-NACL 0.5-0.125-0.9 MG/250ML-% EP SOLN
12.0000 mL/h | EPIDURAL | Status: DC | PRN
  Administered 2021-08-22: 12 mL/h via EPIDURAL
  Filled 2021-08-22: qty 250

## 2021-08-22 NOTE — H&P (Signed)
LABOR AND DELIVERY ADMISSION HISTORY AND PHYSICAL NOTE  Ashley Brewer is a 21 y.o. female G3P0020 with IUP at [redacted]w[redacted]d by 8 week [place] presenting for SROM and SOL. She endorses recurrent painful contractions. She endorses positive fetal movement. She denies vaginal bleeding.  She plans on breast feeding. Her contraception plan is: undecided.  Prenatal History/Complications: PNC at [facility]  Sono:  @[redacted]w[redacted]d , CWD, normal anatomy, cephalic presentation, anterior placenta, 16%ile, EFW 16%  Pregnancy complications:  - Obesity - Velamentous cord insertion - Insufficient prenatal care third trimester (not seen in office since 06/05/2021)  Past Medical History: Past Medical History:  Diagnosis Date   Urinary tract infection    pt. thinks she';s been dx with UTI    Past Surgical History: Past Surgical History:  Procedure Laterality Date   DILATION AND CURETTAGE OF UTERUS     SALPINGECTOMY Left    WISDOM TOOTH EXTRACTION      Obstetrical History: OB History     Gravida  3   Para  0   Term  0   Preterm  0   AB  2   Living         SAB  1   IAB  0   Ectopic  1   Multiple      Live Births              Social History: Social History   Socioeconomic History   Marital status: Single    Spouse name: Not on file   Number of children: Not on file   Years of education: Not on file   Highest education level: Not on file  Occupational History   Not on file  Tobacco Use   Smoking status: Never   Smokeless tobacco: Never  Vaping Use   Vaping Use: Never used  Substance and Sexual Activity   Alcohol use: No    Comment: Patient reports she consumes alcohol (amount unspecified)   Drug use: No    Comment: Patient denies   Sexual activity: Yes    Birth control/protection: None    Comment: "ran out months ago"  Other Topics Concern   Not on file  Social History Narrative   ** Merged History Encounter **       Social Determinants of Health   Financial Resource  Strain: Not on file  Food Insecurity: Not on file  Transportation Needs: Not on file  Physical Activity: Not on file  Stress: Not on file  Social Connections: Not on file    Family History: No family history on file.  Allergies: Allergies  Allergen Reactions   Shellfish Allergy Anaphylaxis   Shrimp [Shellfish Allergy] Nausea And Vomiting and Rash    Medications Prior to Admission  Medication Sig Dispense Refill Last Dose   Prenatal Vit-Fe Fumarate-FA (PRENATAL VITAMIN PO) Take by mouth.      promethazine (PHENERGAN) 25 MG tablet Take 1 tablet (25 mg total) by mouth every 6 (six) hours as needed for nausea or vomiting. 30 tablet 2    pyridoxine (B-6) 100 MG tablet Take 100 mg by mouth daily. (Patient not taking: Reported on 06/05/2021)        Review of Systems  All systems reviewed and negative except as stated in HPI  Physical Exam Blood pressure 129/69, pulse 75, temperature 98.1 F (36.7 C), resp. rate 17, height 5' (1.524 m), weight 85.3 kg, last menstrual period 12/07/2020, SpO2 100 %. General appearance: alert, oriented, NAD Lungs: normal respiratory effort Heart:  regular rate Abdomen: soft, non-tender; gravid Extremities: No calf swelling or tenderness Presentation: cephalic by suture per RN exam  Fetal monitoringBaseline: 130 bpm, Variability: Good {> 6 bpm), Accelerations: Non-reactive but appropriate for gestational age, and Decelerations: Absent Uterine activity: q 2-4 min  Dilation: 4 Effacement (%): 80 Station: 0 Exam by:: [name], RN.  Prenatal labs: ABO, Rh: O/RH(D) POSITIVE/-- (02/02 1402) Antibody: NO ANTIBODIES DETECTED (02/02 1402) Rubella: 4.37 (02/02 1402) RPR: NON-REACTIVE (06/20 0946)  HBsAg: NON-REACTIVE (02/02 1402)  HIV: NON-REACTIVE (02/02 1402)  GC/Chlamydia: Ordered on admission  GBS:   PCR ordered on admission 2-hr GTT: Not visible on chart, HgbA1C ordered on admission Genetic screening:  Declined by patient Anatomy US:  WNL  Prenatal Transfer Tool  Maternal Diabetes: No Genetic Screening: Declined Maternal Ultrasounds/Referrals: Velamentous cord insertion Fetal Ultrasounds or other Referrals:  None Maternal Substance Abuse:  No Significant Maternal Medications:  None Significant Maternal Lab Results: None  Results for orders placed or performed during the hospital encounter of 08/22/21 (from the past 24 hour(s))  POCT fern test   Collection Time: 08/22/21  6:43 AM  Result Value Ref Range   POCT Fern Test Positive = ruptured amniotic membanes     Patient Active Problem List   Diagnosis Date Noted   Echogenic intracardiac focus of fetus on prenatal ultrasound 04/10/2021   Umbilical cord, velamentous insertion 04/10/2021   Supervision of normal pregnancy 02/17/2021   Ectopic pregnancy 10/02/2019   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 G3P0020 at [redacted]w[redacted]d here for SROM at 0300  #Labor: Expectant management #Pain: IV pain meds PRN, epidural upon request #FWB: Reassuring, difficulty tolerating monitors in MAU #GBS/ID: PCR ordered on admission #MOF: Breast #MOC: Undecided #Circ: N/A baby girl #PP: Patient agreeable to HIV lab collection with admission labs  Calvert Cantor, MSA, MSN, CNM 08/22/2021, 6:47 AM

## 2021-08-22 NOTE — Anesthesia Procedure Notes (Signed)
Epidural Patient location during procedure: OB Start time: 08/22/2021 7:58 AM End time: 08/22/2021 8:11 AM  Staffing Anesthesiologist: Lowella Curb, MD Performed: anesthesiologist   Preanesthetic Checklist Completed: patient identified, IV checked, site marked, risks and benefits discussed, surgical consent, monitors and equipment checked, pre-op evaluation and timeout performed  Epidural Patient position: sitting Prep: ChloraPrep Patient monitoring: heart rate, cardiac monitor, continuous pulse ox and blood pressure Approach: midline Location: L2-L3 Injection technique: LOR saline  Needle:  Needle type: Tuohy  Needle gauge: 17 G Needle length: 9 cm Needle insertion depth: 7 cm Catheter type: closed end flexible Catheter size: 20 Guage Catheter at skin depth: 11 cm Test dose: negative  Assessment Events: blood not aspirated, injection not painful, no injection resistance, no paresthesia and negative IV test  Additional Notes Reason for block:procedure for pain

## 2021-08-22 NOTE — Anesthesia Postprocedure Evaluation (Signed)
Anesthesia Post Note  Patient: Ashley Brewer  Procedure(s) Performed: AN AD HOC LABOR EPIDURAL     Patient location during evaluation: Mother Baby Anesthesia Type: Epidural Level of consciousness: awake Pain management: satisfactory to patient Vital Signs Assessment: post-procedure vital signs reviewed and stable Respiratory status: spontaneous breathing Cardiovascular status: stable Anesthetic complications: no   No notable events documented.  Last Vitals:  Vitals:   08/22/21 1521 08/22/21 1631  BP: 124/79 118/77  Pulse: 60 60  Resp: 18 18  Temp: 37.8 C 37.2 C  SpO2: 100% 100%    Last Pain:  Vitals:   08/22/21 1631  TempSrc: Oral  PainSc: 0-No pain   Pain Goal: Patients Stated Pain Goal: 3 (08/22/21 4332)              Epidural/Spinal Function Cutaneous sensation: Normal sensation (08/22/21 1631), Patient able to flex knees: Yes (08/22/21 1631), Patient able to lift hips off bed: Yes (08/22/21 1631), Back pain beyond tenderness at insertion site: No (08/22/21 1631), Progressively worsening motor and/or sensory loss: No (08/22/21 1631), Bowel and/or bladder incontinence post epidural: No (08/22/21 1631)  Cephus Shelling

## 2021-08-22 NOTE — Discharge Summary (Signed)
Postpartum Discharge Summary  Date of Service updated-yes     Patient Name: Ashley Brewer DOB: 2000-04-13 MRN: [id number]  Date of admission: 08/22/2021 Delivery date:08/22/2021  Delivering provider: Darci Current  Date of discharge: 08/24/2021  Admitting diagnosis: Indication for care in labor or delivery [O75.9] Intrauterine pregnancy: [redacted]w[redacted]d     Secondary diagnosis:  Principal Problem:   Indication for care in labor or delivery  Additional problems: None    Discharge diagnosis: Term Pregnancy Delivered                                              Postpartum procedures: None Augmentation: N/A Complications: None  Hospital course: Onset of Labor With Vaginal Delivery      21 y.o. yo G3P0020 at [redacted]w[redacted]d was admitted in Active Labor on 08/22/2021. Patient had an uncomplicated labor course as follows:  Membrane Rupture Time/Date: 3:00 AM ,08/22/2021   Delivery Method:Vaginal, Spontaneous  Episiotomy: None  Lacerations:  1st degree;Periurethral  Patient had an uncomplicated postpartum course.  She is ambulating, tolerating a regular diet, passing flatus, and urinating well. Patient is discharged home in stable condition on 08/24/21.  Newborn Data: Birth date:08/22/2021  Birth time:12:48 PM  Gender:Female  Living status:Living  Apgars:6 ,8  Weight:2460 g   Magnesium Sulfate received: No BMZ received: No Rhophylac:N/A MMR:N/A T-DaP: Offered postpartum Flu: N/A Transfusion:No  Physical exam  Vitals:   08/23/21 0501 08/23/21 2147 08/23/21 2300 08/24/21 0530  BP: 125/75 126/85 130/78 125/83  Pulse: 60 60 (!) 59 (!) 59  Resp: $Remo'18 17  16  'UwtTW$ Temp: 98 F (36.7 C) 99 F (37.2 C)  98 F (36.7 C)  TempSrc: Oral Oral  Oral  SpO2: 100% 99%  99%  Weight:      Height:       General: alert, cooperative, and no distress Lochia: appropriate Uterine Fundus: firm Incision: N/A DVT Evaluation: No evidence of DVT seen on physical exam. Negative Homan's sign. No cords or calf  tenderness. No significant calf/ankle edema. Labs: Lab Results  Component Value Date   WBC 12.0 (H) 08/23/2021   HGB 11.4 (L) 08/23/2021   HCT 34.0 (L) 08/23/2021   MCV 89.0 08/23/2021   PLT 166 08/23/2021      Latest Ref Rng & Units 01/23/2021   12:50 AM  CMP  Glucose 70 - 99 mg/dL 90   BUN 6 - 20 mg/dL <5   Creatinine 0.44 - 1.00 mg/dL 0.57   Sodium 135 - 145 mmol/L 136   Potassium 3.5 - 5.1 mmol/L 3.3   Chloride 98 - 111 mmol/L 107   CO2 22 - 32 mmol/L 21   Calcium 8.9 - 10.3 mg/dL 9.2   Total Protein 6.5 - 8.1 g/dL 6.4   Total Bilirubin 0.3 - 1.2 mg/dL 0.6   Alkaline Phos 38 - 126 U/L 40   AST 15 - 41 U/L 18   ALT 0 - 44 U/L 19    Edinburgh Score:    08/23/2021    5:53 PM  Edinburgh Postnatal Depression Scale Screening Tool  I have been able to laugh and see the funny side of things. 0  I have looked forward with enjoyment to things. 0  I have blamed myself unnecessarily when things went wrong. 0  I have been anxious or worried for no good reason. 0  I have  felt scared or panicky for no good reason. 0  Things have been getting on top of me. 0  I have been so unhappy that I have had difficulty sleeping. 0  I have felt sad or miserable. 0  I have been so unhappy that I have been crying. 0  The thought of harming myself has occurred to me. 0  Edinburgh Postnatal Depression Scale Total 0     After visit meds:  Allergies as of 08/24/2021       Reactions   Shellfish Allergy Anaphylaxis   Shrimp [shellfish Allergy] Nausea And Vomiting, Rash        Medication List     STOP taking these medications    promethazine 25 MG tablet Commonly known as: PHENERGAN   pyridoxine 100 MG tablet Commonly known as: B-6       TAKE these medications    ibuprofen 600 MG tablet Commonly known as: ADVIL Take 1 tablet (600 mg total) by mouth every 6 (six) hours.   PRENATAL VITAMIN PO Take by mouth.         Discharge home in stable condition Infant Feeding:  Breast Infant Disposition:home with mother Discharge instruction: per After Visit Summary and Postpartum booklet. Activity: Advance as tolerated. Pelvic rest for 6 weeks.  Diet: routine diet  Future Appointments: Future Appointments  Date Time Provider [place]  08/28/2021  2:50 PM Radene Gunning, MD [place] [facility]   Follow up Visit: Message sent to [place] by Candie Chroman, CNM 08/22/21  Please schedule this patient for a In person postpartum visit in 4 weeks with the following provider: Any provider. Additional Postpartum F/U: None   Low risk pregnancy complicated by:  Limited PNC, velamentous cord Delivery mode:  Vaginal, Spontaneous  Anticipated Birth Control:  Unsure   08/24/2021 Julianne Handler, CNM

## 2021-08-22 NOTE — Lactation Note (Signed)
This note was copied from a baby's chart. Lactation Consultation Note  Patient Name: Ashley Brewer KPTWS'F Date: 08/22/2021 Reason for consult: Initial assessment;1st time breastfeeding;Infant < 6lbs;Early term 37-38.6wks Age:22 hours, P1, ETI female infant less than 6 lbs at birth. Parents have been doing STS with infant. Per Birth Parent this will be infant first time latching at the breast. Birth Parent latched infant on her left breast using the cross cradle hold position, infant was on and off breast but started sustaining latch more towards the end of the feeding, infant BF for 11 minutes. Birth Parent was given hand pump to pre-pump breast prior to latching infant see below in latch assessment for nipple type, was fitted with 21 mm breast flange. Birth Parent was taught how to hand express and infant was given 6 mls of colostrum by spoon. When Birth Parent was shown how to use hand pump, colostrum was easily being expressed in breast flange that was finger feed to infant. Birth Parent feeding plan: 1-Continue to pre-pump breast prior to latching infant, BF according to hunger cues, 8 to 12+ times within 24 hours, STS. 2- Due infant birth weight less than 6 lbs BF infant 30 minutes or less per  feeding.  3- After latching infant at the breast, Birth Parent can or use hand pump and give back EBM by spoon or finger feeding. 4- Birth Parent knows to call RN/LC for further latch assistance if needed. Maternal Data Has patient been taught Hand Expression?: Yes  Feeding Mother's Current Feeding Choice: Breast Milk  LATCH Score Latch: Repeated attempts needed to sustain latch, nipple held in mouth throughout feeding, stimulation needed to elicit sucking reflex.  Audible Swallowing: A few with stimulation  Type of Nipple: Flat  Comfort (Breast/Nipple): Soft / non-tender  Hold (Positioning): Assistance needed to correctly position infant at breast and maintain latch.  LATCH Score:  6   Lactation Tools Discussed/Used Tools: Pump Breast pump type: Manual Pump Education: Setup, frequency, and cleaning;Milk Storage Reason for Pumping: Birth Parent has flat nipples, help evert nipple shaft out more, pre-pump before latch. Pumping frequency: Pre-pump before latching infant at the breast.  Interventions Interventions: Breast feeding basics reviewed;Assisted with latch;Skin to skin;Hand express;Pre-pump if needed;Adjust position;Breast compression;Support pillows;Position options;Expressed milk;Education;Hand pump;LC Services brochure  Discharge Pump: Manual (Hand Pump given, Parents stated their [employer] will not pay for DEBP was informed by insurance company.)  Consult Status Consult Status: Follow-up Date: 08/23/21 Follow-up type: In-patient    Danelle Earthly 08/22/2021, 6:14 PM

## 2021-08-22 NOTE — MAU Note (Signed)
.  Ashley Brewer is a 21 y.o. at [redacted]w[redacted]d here in MAU reporting SROM and ctxs since 0300. Fld is clear with yellow d/c. No recent sve. Good FM. No VB  Onset of complaint: 0300 Pain score: 10 Vitals:   08/22/21 0623  Pulse: 75  Resp: 17  Temp: 98.1 F (36.7 C)  SpO2: 100%     FHT:142 Lab orders placed from triage:

## 2021-08-22 NOTE — Progress Notes (Signed)
Labor Progress Note Ashley Brewer is a 21 y.o. G3P0020 at [redacted]w[redacted]d presented for SOL, SROM S: Patient is resting comfortably with epidural.  O:  BP 113/79   Pulse 67   Temp 98.1 F (36.7 C)   Resp 17   Ht 5' (1.524 m)   Wt 85.3 kg   LMP 12/07/2020 (Approximate)   SpO2 99%   BMI 36.72 kg/m  EFM: 150/+ accelerations /no decelerations   CVE: Dilation: 4 Effacement (%): 80 Cervical Position: Anterior Station: 0 Exam by:: [name], RN.   A&P: 21 y.o. G3P0020 [redacted]w[redacted]d  #Labor: Progressing well. Expectant management. Appropriate contraction pattern  #Pain: epidural  #FWB: Cat 1 #GBS negative   Glendale Chard, DO [facility] [employer] 10:03 AM

## 2021-08-22 NOTE — Anesthesia Preprocedure Evaluation (Signed)
Anesthesia Evaluation  Patient identified by MRN, date of birth, ID band Patient awake    Reviewed: Allergy & Precautions, NPO status , Patient's Chart, lab work & pertinent test results  Airway Mallampati: II  TM Distance: >3 FB Neck ROM: Full    Dental no notable dental hx.    Pulmonary neg pulmonary ROS,    Pulmonary exam normal breath sounds clear to auscultation       Cardiovascular negative cardio ROS Normal cardiovascular exam Rhythm:Regular Rate:Normal     Neuro/Psych negative neurological ROS  negative psych ROS   GI/Hepatic negative GI ROS, Neg liver ROS,   Endo/Other  negative endocrine ROS  Renal/GU negative Renal ROS  negative genitourinary   Musculoskeletal negative musculoskeletal ROS (+)   Abdominal (+) + obese,   Peds negative pediatric ROS (+)  Hematology negative hematology ROS (+)   Anesthesia Other Findings   Reproductive/Obstetrics (+) Pregnancy                             Anesthesia Physical Anesthesia Plan  ASA: 2  Anesthesia Plan: Epidural   Post-op Pain Management:    Induction:   PONV Risk Score and Plan:   Airway Management Planned:   Additional Equipment:   Intra-op Plan:   Post-operative Plan:   Informed Consent:   Plan Discussed with:   Anesthesia Plan Comments:         Anesthesia Quick Evaluation  

## 2021-08-23 LAB — CBC
HCT: 34 % — ABNORMAL LOW (ref 36.0–46.0)
Hemoglobin: 11.4 g/dL — ABNORMAL LOW (ref 12.0–15.0)
MCH: 29.8 pg (ref 26.0–34.0)
MCHC: 33.5 g/dL (ref 30.0–36.0)
MCV: 89 fL (ref 80.0–100.0)
Platelets: 166 10*3/uL (ref 150–400)
RBC: 3.82 MIL/uL — ABNORMAL LOW (ref 3.87–5.11)
RDW: 13.5 % (ref 11.5–15.5)
WBC: 12 10*3/uL — ABNORMAL HIGH (ref 4.0–10.5)
nRBC: 0 % (ref 0.0–0.2)

## 2021-08-23 NOTE — Lactation Note (Signed)
This note was copied from a baby's chart. Lactation Consultation Note  Patient Name: Ashley Brewer [id number] Date: 08/23/2021 Reason for consult: Follow-up assessment;Primapara;1st time breastfeeding;Early term 37-38.6wks;Infant < 6lbs;Infant weight loss;Breastfeeding assistance (6.54% WL) Age:22 hours  P1, Early Term, Infant Female, 6.54% WL  LC entered the room and baby was asleep in the bassinet. The birthing parent stated that breastfeeding was going better than before.   LC spoke with the parent about the Late Preterm Infant Policy.   LC spoke to RN this morning about setting up DEBP, but she was unable to do so due to high census.   LC set up the DEBP and birthing parent is using a size #21 flange.   LC attempted to assist baby latch on left breast in the football hold. Baby was not interested.   LC spoke with the parents about milk storage, supply and demand, infant behavior, and all questions were answered.   The birthing parent states that she does not have a pump at home. LC left the stork pump form with the birthing parent. RN notified.   LC encouraged the birthing parent to call for assistance with latch.    Current Feeding Plan:  Breastfeed 8+ times in 24 hours.  Put baby to the breast prior to supplementing.  Supplement according to late preterm infant policy.  Call RN/LC for assistance with breastfeeding.  Feeding Nipple Type: Nfant Standard Flow (white)   Lactation Tools Discussed/Used Tools: Pump;Flanges Flange Size: 21 Breast pump type: Double-Electric Breast Pump Pump Education: Setup, frequency, and cleaning;Milk Storage Reason for Pumping: Infant <6lbs Pumping frequency: after each feeding  Interventions Interventions: Breast feeding basics reviewed;Education;DEBP  Discharge    Consult Status Consult Status: Follow-up Date: 08/24/21 Follow-up type: In-patient    Ashley Brewer 08/23/2021, 2:40 PM
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
## 2021-08-23 NOTE — Progress Notes (Signed)
Post Partum Day 1 Subjective: no complaints, up ad lib, voiding, and tolerating PO, pain well controlled  Objective: Blood pressure 125/75, pulse 60, temperature 98 F (36.7 C), temperature source Oral, resp. rate 18, height 5' (1.524 m), weight 85.2 kg, last menstrual period 12/07/2020, SpO2 100 %, unknown if currently breastfeeding.  Physical Exam:  General: alert, cooperative, and no distress Lochia: appropriate Uterine Fundus: firm Incision: n/a DVT Evaluation: No evidence of DVT seen on physical exam. Negative Homan's sign. No cords or calf tenderness. No significant calf/ankle edema.  Recent Labs    08/22/21 0650 08/23/21 0421  HGB 13.0 11.4*  HCT 38.3 34.0*    Assessment/Plan: Plan for discharge tomorrow, Breastfeeding, and Lactation consult   LOS: 1 day   Donette Larry, CNM 08/23/2021, 7:08 AM

## 2021-08-24 MED ORDER — IBUPROFEN 600 MG PO TABS
600.0000 mg | ORAL_TABLET | Freq: Four times a day (QID) | ORAL | 0 refills | Status: AC
Start: 2021-08-24 — End: ?

## 2021-08-24 NOTE — Lactation Note (Signed)
This note was copied from a baby's chart. Lactation Consultation Note  Patient Name: Ashley Brewer [id number] Date: 08/24/2021 Reason for consult: Follow-up assessment;Primapara;1st time breastfeeding;Early term 37-38.6wks;Infant weight loss;Breastfeeding assistance (5.08% WL) Age:22 hours  P1, Early Term, Infant Female, 5.08%WL  LC entered into the room and the parents were preparing for discharge. Per the parents, baby has not been latching and the birthing parent has done some pumping.   LC encouraged the birthing parent to pump q3hrs.   LC spoke with the parents about engorgement, warning signs, infant I/O, and outpatient services.   LC also assisted the parents in getting a [facility] loaner pump since the birthing parent does not have a pump at home. A WIC referral was sent.   Current Feeding Plan:  Put baby to the breast prior to feeding expressed milk. Pump q3hrs and feed expressed milk to baby via a bottle.  Watch baby's output and call the pediatrician with questions or concerns.  Call the outpatient [facility] for assistance with breastfeeding.   Maternal Data    Feeding Nipple Type: Nfant Standard Flow (white)  LATCH Score                    Lactation Tools Discussed/Used    Interventions Interventions: Breast feeding basics reviewed;Education  Discharge Discharge Education: Engorgement and breast care;Warning signs for feeding baby;Outpatient recommendation  Consult Status Consult Status: Complete Date: 08/24/21 Follow-up type: Call as needed    [facility] 08/24/2021, 11:20 AM

## 2021-08-25 ENCOUNTER — Telehealth: Payer: Self-pay | Admitting: *Deleted

## 2021-08-25 LAB — GC/CHLAMYDIA PROBE AMP (~~LOC~~) NOT AT ARMC
Chlamydia: NEGATIVE
Comment: NEGATIVE
Comment: NORMAL
Neisseria Gonorrhea: NEGATIVE

## 2021-08-25 NOTE — Telephone Encounter (Signed)
Patient advised to fax or bring in [facility] paper work.

## 2021-08-28 ENCOUNTER — Encounter: Payer: Medicaid Other | Admitting: Obstetrics and Gynecology

## 2021-08-30 ENCOUNTER — Telehealth (HOSPITAL_COMMUNITY): Payer: Self-pay

## 2021-08-30 NOTE — Telephone Encounter (Signed)
Patient did not answer phone call. Voicemail left for patient.   Marcelino Duster [facility] 08/30/2021,1710

## 2021-09-08 ENCOUNTER — Encounter (INDEPENDENT_AMBULATORY_CARE_PROVIDER_SITE_OTHER): Payer: Self-pay

## 2021-09-08 ENCOUNTER — Telehealth: Payer: Medicaid Other | Admitting: Obstetrics and Gynecology

## 2021-09-09 ENCOUNTER — Telehealth (INDEPENDENT_AMBULATORY_CARE_PROVIDER_SITE_OTHER): Payer: Medicaid Other | Admitting: Certified Nurse Midwife

## 2021-09-09 MED ORDER — PRENATAL VITAMIN 27-0.8 MG PO TABS: 1.0000 | ORAL_TABLET | Freq: Every day | ORAL | 11 refills | Status: DC

## 2021-09-09 MED ORDER — IBUPROFEN 600 MG PO TABS: 600.0000 mg | ORAL_TABLET | Freq: Four times a day (QID) | ORAL | 0 refills | Status: DC | PRN

## 2021-09-09 NOTE — Progress Notes (Unsigned)
    Post Partum Visit Note  Ashley Brewer is a 21 y.o. [phone] female who presents for a postpartum visit. She is 2 weeks postpartum following a normal spontaneous vaginal delivery.  I have fully reviewed the prenatal and intrapartum course. The delivery was at 38.1 gestational weeks.  Anesthesia: epidural. Postpartum course has been unremarkable. Baby is doing well. Baby is feeding by bottle - Similac Advance. Bleeding staining only. Bowel function is normal. Bladder function is normal. Patient is not sexually active. Contraception method is none. Postpartum depression screening: negative.   The pregnancy intention screening data noted above was reviewed. Potential methods of contraception were discussed. The patient elected to proceed with No data recorded.    Health Maintenance Due  Topic Date Due   COVID-19 Vaccine (1) Never done   HPV VACCINES (1 - 2-dose series) Never done   TETANUS/TDAP  Never done   INFLUENZA VACCINE  08/19/2021    {Common ambulatory SmartLinks:19316}  Review of Systems {ros; complete:30496}  Objective:  LMP 12/07/2020 (Approximate)    General:  {gen appearance:16600}   Breasts:  {desc; normal/abnormal/not indicated:14647}  Lungs: {lung exam:16931}  Heart:  {heart exam:5510}  Abdomen: {abdomen exam:16834}   Wound {Wound assessment:11097}  GU exam:  {desc; normal/abnormal/not indicated:14647}       Assessment:    There are no diagnoses linked to this encounter.  *** postpartum exam.   Plan:   Essential components of care per ACOG recommendations:  1.  Mood and well being: Patient with negative depression screening today. Reviewed local resources for support.  - Patient tobacco use? No.   - hx of drug use? No.    2. Infant care and feeding:  -Patient currently breastmilk feeding? Yes. Discussed returning to work and pumping.  -Social determinants of health (SDOH) reviewed in EPIC. No concerns  3. Sexuality, contraception and birth  spacing - Patient does not want a pregnancy in the next year.  Desired family size is undecided children.  - Reviewed reproductive life planning. Reviewed contraceptive methods based on pt preferences and effectiveness.  Patient desired {Upstream End Methods:24109} today.   - Discussed birth spacing of 18 months  4. Sleep and fatigue -Encouraged family/partner/community support of 4 hrs of uninterrupted sleep to help with mood and fatigue  5. Physical Recovery  - Discussed patients delivery and complications. She describes her labor as good. - Patient had a Vaginal, no problems at delivery. Patient had a  no  laceration. Perineal healing reviewed. Patient expressed understanding - Patient has urinary incontinence? No. - Patient {ACTION; IS/IS ELF:81017510} safe to resume physical and sexual activity  6.  Health Maintenance - HM due items addressed {Yes or If no, why not?:20788} - Last pap smear  Diagnosis  Date Value Ref Range Status  02/20/2021   Final   - Negative for intraepithelial lesion or malignancy (NILM)   Pap smear {done:10129} at today's visit.  -Breast Cancer screening indicated? {indicated:25516}  7. Chronic Disease/Pregnancy Condition follow up: {Follow up:25499}  - PCP follow up  Kathie Dike, CMA [facility] [employer], [facility]

## 2021-09-10 ENCOUNTER — Encounter: Payer: Self-pay | Admitting: Certified Nurse Midwife

## 2021-09-19 ENCOUNTER — Telehealth: Payer: Medicaid Other | Admitting: Obstetrics and Gynecology

## 2022-04-23 ENCOUNTER — Institutional Professional Consult (permissible substitution): Payer: Medicaid Other | Admitting: Plastic Surgery

## 2023-05-31 ENCOUNTER — Ambulatory Visit: Admitting: Obstetrics & Gynecology

## 2023-06-02 ENCOUNTER — Ambulatory Visit: Admitting: Family Medicine

## 2023-08-18 ENCOUNTER — Telehealth

## 2023-08-18 ENCOUNTER — Ambulatory Visit: Admitting: Family Medicine

## 2023-08-18 DIAGNOSIS — Z7689 Persons encountering health services in other specified circumstances: Secondary | ICD-10-CM | POA: Insufficient documentation

## 2023-08-25 ENCOUNTER — Ambulatory Visit: Admitting: Family Medicine

## 2023-08-31 ENCOUNTER — Ambulatory Visit: Admitting: General Practice

## 2023-08-31 ENCOUNTER — Ambulatory Visit (HOSPITAL_BASED_OUTPATIENT_CLINIC_OR_DEPARTMENT_OTHER): Admitting: Student

## 2023-10-09 ENCOUNTER — Encounter

## 2023-10-26 ENCOUNTER — Institutional Professional Consult (permissible substitution): Payer: Self-pay | Admitting: Plastic Surgery

## 2023-11-08 ENCOUNTER — Ambulatory Visit

## 2023-11-24 ENCOUNTER — Ambulatory Visit

## 2024-01-06 ENCOUNTER — Ambulatory Visit: Admitting: Plastic Surgery

## 2024-01-06 ENCOUNTER — Encounter: Payer: Self-pay | Admitting: Plastic Surgery

## 2024-01-06 VITALS — BP 119/66 | HR 107 | Temp 99.8°F | Ht 60.0 in | Wt 181.2 lb

## 2024-01-06 DIAGNOSIS — M546 Pain in thoracic spine: Secondary | ICD-10-CM | POA: Diagnosis not present

## 2024-01-06 DIAGNOSIS — M542 Cervicalgia: Secondary | ICD-10-CM

## 2024-01-06 DIAGNOSIS — N62 Hypertrophy of breast: Secondary | ICD-10-CM | POA: Diagnosis not present

## 2024-01-06 DIAGNOSIS — N6489 Other specified disorders of breast: Secondary | ICD-10-CM

## 2024-01-06 NOTE — Progress Notes (Signed)
 Referring Provider No referring provider defined for this encounter.   CC:  Chief Complaint  Patient presents with   Advice Only      Ashley Brewer is an 23 y.o. female.  HPI: Ashley Brewer is a 23 year old female who presents today with complaints of upper back and neck pain which she attributes to the large size of her breast.  She states that is very difficult to find bras that fit and the ones that she does are very expensive and still cut into her shoulders leading grooves and discomfort.  She states that she has had large breast like the rest of the women in her family since she began breast development.  Her breast became significantly larger after the birth of her child 2 years ago.  She is interested in a bilateral breast reduction.  She denies any significant past medical history and is not on blood thinners and does not smoke.  Allergies[1]  Outpatient Encounter Medications as of 01/06/2024  Medication Sig   [DISCONTINUED] ibuprofen  (ADVIL ) 600 MG tablet Take 1 tablet (600 mg total) by mouth every 6 (six) hours.   [DISCONTINUED] ibuprofen  (ADVIL ) 600 MG tablet Take 1 tablet (600 mg total) by mouth every 6 (six) hours as needed for cramping.   [DISCONTINUED] Prenatal Vit-Fe Fumarate-FA (PRENATAL VITAMIN PO) Take by mouth. (Patient not taking: Reported on 09/09/2021)   [DISCONTINUED] Prenatal Vit-Fe Fumarate-FA (PRENATAL VITAMIN) 27-0.8 MG TABS Take 1 tablet by mouth daily.   No facility-administered encounter medications on file as of 01/06/2024.     Past Medical History:  Diagnosis Date   Urinary tract infection    pt. thinks she';s been dx with UTI    Past Surgical History:  Procedure Laterality Date   DILATION AND CURETTAGE OF UTERUS     SALPINGECTOMY Left    WISDOM TOOTH EXTRACTION      No family history on file.  Social History   Social History Narrative   ** Merged History Encounter **         Review of Systems General: Denies fevers, chills, weight  loss CV: Denies chest pain, shortness of breath, palpitations Breast: Large breast contributing to upper back and neck pain  Physical Exam    01/06/2024    3:25 PM 08/24/2021    5:30 AM 08/23/2021   11:00 PM  Vitals with BMI  Height 5' 0    Weight 181 lbs 3 oz    BMI 35.39    Systolic 119 125 869  Diastolic 66 83 78  Pulse 107 59 59    General:  No acute distress,  Alert and oriented, Non-Toxic, Normal speech and affect Breast: Patient has very large dense pendulous breasts.  She has grade 2 ptosis.  There is a slight asymmetry with the left greater than the right.  There are no dominant masses on physical examination and the nipples are normal in appearance without nipple discharge.  Her sternal notch to nipple distance on the right is 30 cm and 32 cm on the left her nipple to fold distance on the right is 19 cm 19 cm on the left. Mammogram: Not applicable due to age Assessment/Plan Symptomatic macromastia: Patient has large dense breasts and I believe she would benefit from bilateral breast reduction.  I believe that I can remove between 700 g and 800 g per breast.  She understands no specific cup size can or is guaranteed.  We discussed breast reductions at length including the location of the  incisions and the unpredictable nature of scarring and wound healing.  Specifically we discussed the fact that scarring is a biologic process and is generally based on the patient's wound healing abilities.  We discussed the surgical risks of bleeding, infection, and seroma formation.  She understands I will use drains postoperatively.  She will need to wear supportive compressive garment for 6 weeks.  We discussed the risk of nipple loss due to nipple ischemia.  We discussed the possibility that she may not be able to successfully breast-feed after breast reduction.  We discussed the fact that she will have asymmetry of the breasts postoperatively however I will work to minimize this as much as possible.   Regardless there will be a difference between the 2 breasts.  We discussed the postoperative limitations including no heavy lifting greater than 20 pounds, no vigorous activity, no submerging the incisions in water for 6 weeks.  She will be asked to begin ambulation immediately after surgery to help decrease the risk of DVT.  She may return to light activity as tolerated.  All questions were answered to her satisfaction.  Photographs obtained today with her consent.  Will submit her for a bilateral breast reduction at her request.  Ashley Brewer 01/06/2024, 5:05 PM         [1]  Allergies Allergen Reactions   Shellfish Allergy Anaphylaxis   Shrimp [Shellfish Allergy] Nausea And Vomiting and Rash

## 2024-02-02 ENCOUNTER — Ambulatory Visit: Admitting: General Practice

## 2024-02-16 ENCOUNTER — Ambulatory Visit

## 2024-03-10 ENCOUNTER — Institutional Professional Consult (permissible substitution)
